# Patient Record
Sex: Male | Born: 1980 | Race: White | Hispanic: No | Marital: Single | Smoking: Never smoker
Health system: Southern US, Community
[De-identification: ages and names within clinical notes are randomized; demographics above are authoritative.]

## PROBLEM LIST (undated history)

## (undated) DIAGNOSIS — T7840XA Allergy, unspecified, initial encounter: Secondary | ICD-10-CM

## (undated) DIAGNOSIS — S42009A Fracture of unspecified part of unspecified clavicle, initial encounter for closed fracture: Secondary | ICD-10-CM

## (undated) DIAGNOSIS — F32A Depression, unspecified: Secondary | ICD-10-CM

## (undated) DIAGNOSIS — F329 Major depressive disorder, single episode, unspecified: Secondary | ICD-10-CM

## (undated) HISTORY — DX: Allergy, unspecified, initial encounter: T78.40XA

## (undated) HISTORY — DX: Major depressive disorder, single episode, unspecified: F32.9

## (undated) HISTORY — DX: Depression, unspecified: F32.A

---

## 2000-06-17 ENCOUNTER — Emergency Department (HOSPITAL_COMMUNITY): Admission: EM | Admit: 2000-06-17 | Discharge: 2000-06-17 | Payer: Self-pay | Admitting: Emergency Medicine

## 2000-06-17 ENCOUNTER — Encounter: Payer: Self-pay | Admitting: Emergency Medicine

## 2006-11-10 ENCOUNTER — Ambulatory Visit: Payer: Self-pay | Admitting: Family Medicine

## 2006-11-10 DIAGNOSIS — H6120 Impacted cerumen, unspecified ear: Secondary | ICD-10-CM | POA: Insufficient documentation

## 2006-11-10 DIAGNOSIS — J309 Allergic rhinitis, unspecified: Secondary | ICD-10-CM | POA: Insufficient documentation

## 2009-05-27 ENCOUNTER — Ambulatory Visit: Payer: Self-pay | Admitting: Family Medicine

## 2009-05-27 ENCOUNTER — Telehealth (INDEPENDENT_AMBULATORY_CARE_PROVIDER_SITE_OTHER): Payer: Self-pay | Admitting: *Deleted

## 2009-05-29 ENCOUNTER — Encounter (INDEPENDENT_AMBULATORY_CARE_PROVIDER_SITE_OTHER): Payer: Self-pay | Admitting: *Deleted

## 2010-05-12 NOTE — Miscellaneous (Signed)
Summary: tb read  Clinical Lists Changes  Observations: Added new observation of TB PPDRESULT: negative (05/29/2009 16:15) Added new observation of PPD RESULT: < 5mm (05/29/2009 16:15) Added new observation of TB-PPD RDDTE: 05/29/2009 (05/29/2009 16:15)      PPD Results    Date of reading: 05/29/2009    Results: < 5mm    Interpretation: negative

## 2010-05-12 NOTE — Progress Notes (Signed)
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  WILL TAKE FORM TO DANIELLE TO HOLD BECAUSE PT WILL BE COMING IN AFTER LAB CLOSES ON THURSDAY (ABOUT 4:20 OR 4:25--WORKS IN [facility]) Initial call taken by: Jerolyn Shin,  May 27, 2009 4:29 PM  Follow-up for Phone Call        left message for pt to call back. Pt needs appt for form to be filled out. Army Fossa CMA  May 27, 2009 4:51 PM     Additional Follow-up for Phone Call Additional follow up Details #2::    PATIENT WILL NOT BE ABLE TO COME FOR ANY OF THE APPT TIMES. PATIENT WILL COME PICK UP HIS FORM WHEN HE COMES TO GET HIS TB TEST READ. Follow-up by: Barb Merino,  May 27, 2009 5:05 PM

## 2010-05-12 NOTE — Assessment & Plan Note (Signed)
Summary: new  to establish,cbs   Vital Signs:  Patient Profile:   30 Years Old Male Height:     70.25 inches Weight:      193.8 pounds Temp:     98.1 degrees F oral Pulse rate:   76 / minute Resp:     20 per minute BP sitting:   120 / 82  (left arm) Cuff size:   regular  Vitals Entered By: Shonna Chock (November 10, 2006 1:57 PM)               PCP:  Laury Axon  Chief Complaint:  NEW PATIENT ESTABLISH: LOOK AT RIGHT EAR-AT TIMES PATIENT IS UNABLE TO HEAR.  History of Present Illness: Stephen Norton here to establish.  Stephen Norton had problem 2 weeks ago with r ear feeling full.  No other complaints except recent allergy acting up.  Current Allergies: No known allergies   Past Medical History:    Allergic rhinitis  Past Surgical History:    Denies surgical history   Family History:    MOTHER:LIVING    FATHER:LIVING    2:BROTHERS-1 ADOPTED    2:SISTERS    HEART: UNCLE-FATHERS SIDE    ZOX:[id number]    Family History Diabetes 1st degree relative: FATHER    LEUKEMIA:FATHER    MS: FATHER      Social History:    Runner, broadcasting/film/video--- history    Occupation:    Married    Never Smoked    Alcohol use-yes    Drug use-no    Regular exercise-yes   Risk Factors:  Tobacco use:  never Passive smoke exposure:  no Drug use:  no HIV high-risk behavior:  no Alcohol use:  yes    Type:  OCASSIONLLY    Drinks per day:  <1    Has patient --       Felt need to cut down:  no       Been annoyed by complaints:  no       Felt guilty about drinking:  no       Needed eye opener in the morning:  no    Counseled to quit/cut down alcohol use:  no Exercise:  yes    Times per week:  7    Type:  walking, running Seatbelt use:  100 %  Family History Risk Factors:    Family History of MI in females < 58 years old:  no    Family History of MI in males < 30 years old:  yes   Review of Systems      See HPI  General      Denies chills, fatigue, fever, loss of appetite, malaise, sleep disorder, sweats, weakness,  and weight loss.  ENT      Complains of decreased hearing.      Denies difficulty swallowing, ear discharge, earache, hoarseness, nasal congestion, nosebleeds, postnasal drainage, ringing in ears, sinus pressure, and sore throat.  CV      Denies bluish discoloration of lips or nails, chest pain or discomfort, difficulty breathing at night, difficulty breathing while lying down, fainting, fatigue, leg cramps with exertion, lightheadness, near fainting, palpitations, shortness of breath with exertion, swelling of feet, swelling of hands, and weight gain.  Resp      Denies chest discomfort, chest pain with inspiration, cough, coughing up blood, excessive snoring, hypersomnolence, morning headaches, pleuritic, shortness of breath, sputum productive, and wheezing.  Allergy      Complains of seasonal allergies and sneezing.  Denies hives or rash, itching eyes, and persistent infections.   Physical Exam  General:     Well-developed,well-nourished,in no acute distress; alert,appropriate and cooperative throughout examination Ears:     R cerumen impaction and L Cerumen impaction.  ---irrigated with good success Nose:     mucosal erythema and mucosal edema.   Mouth:     Oral mucosa and oropharynx without lesions or exudates.  Teeth in good repair. Lungs:     Normal respiratory effort, chest expands symmetrically. Lungs are clear to auscultation, no crackles or wheezes. Heart:     Normal rate and regular rhythm. S1 and S2 normal without gallop, murmur, click, rub or other extra sounds.    Impression & Recommendations:  Problem # 1:  ALLERGIC RHINITIS (ICD-477.9) xyzal samples to try nasocort AQ Call or RTO prn Discussed use of allergy medications and environmental measures.   Problem # 2:  CERUMEN IMPACTION, RIGHT (ICD-380.4) Assessment: New irrigated with success Stephen Norton instructed  not to use Qtips--  but tissue wick instead Call or RTO prn

## 2011-11-18 ENCOUNTER — Telehealth: Payer: Self-pay | Admitting: Family Medicine

## 2011-11-18 NOTE — Telephone Encounter (Signed)
Called cb# ABM LM to call back to go over TB test info & To schedule CPE

## 2011-11-18 NOTE — Telephone Encounter (Signed)
Last seen 11/10/06.  Please advise     KP

## 2011-11-18 NOTE — Telephone Encounter (Signed)
Pt called says he started a new job that requires forms to be filled out for vision, hearing etc & he would need his last TB Test results. I offered him a physical appt 9.30 1st available he stated is was to far off. He wanted to know if we could confirm last TB test (DOS) & results so he could put on his forms. Cb# [phone]

## 2011-11-18 NOTE — Telephone Encounter (Signed)
TB tests are only good for a year---so if it has been over a year he would need another one anyway

## 2011-11-19 NOTE — Telephone Encounter (Signed)
noted 

## 2011-11-19 NOTE — Telephone Encounter (Signed)
Spoke with patient advised as below he will check with his employer as he stated they said he could just bring in his last one and that should be sufficient.  He still does not want to have a physical, but he may call back and request a copy of his last TB test results Pt was advised no dr. Lenox Ponds w/o a physical appt since he has not been seen since 2008

## 2011-11-19 NOTE — Telephone Encounter (Signed)
FYI--- Patient would not schedule an apt.   KP

## 2013-02-24 ENCOUNTER — Emergency Department
Admission: EM | Admit: 2013-02-24 | Discharge: 2013-02-24 | Disposition: A | Discharge status: Home / Self Care | Payer: BC Managed Care – PPO | Source: Home / Self Care | Attending: Family Medicine | Admitting: Family Medicine

## 2013-02-24 ENCOUNTER — Encounter: Payer: Self-pay | Admitting: Emergency Medicine

## 2013-02-24 ENCOUNTER — Emergency Department (INDEPENDENT_AMBULATORY_CARE_PROVIDER_SITE_OTHER): Payer: BC Managed Care – PPO

## 2013-02-24 DIAGNOSIS — S838X9A Sprain of other specified parts of unspecified knee, initial encounter: Secondary | ICD-10-CM

## 2013-02-24 DIAGNOSIS — S86111A Strain of other muscle(s) and tendon(s) of posterior muscle group at lower leg level, right leg, initial encounter: Secondary | ICD-10-CM

## 2013-02-24 DIAGNOSIS — M79609 Pain in unspecified limb: Secondary | ICD-10-CM

## 2013-02-24 HISTORY — DX: Fracture of unspecified part of unspecified clavicle, initial encounter for closed fracture: S42.009A

## 2013-02-24 MED ORDER — MELOXICAM 15 MG PO TABS: 15.0000 mg | ORAL_TABLET | Freq: Every day | ORAL | Status: DC

## 2013-02-24 NOTE — ED Provider Notes (Signed)
CSN: [id number]     Arrival date & time 02/24/13  1437 History   First MD Initiated Contact with Patient 02/24/13 1440     Chief Complaint  Patient presents with  . Leg Pain    HPI  R lower leg pain x 1 week Pt was playing basketball last week Saturday when another player landed into his R lateral calf.  Initial blow was very intense per pt.  Pt states that he has had secondary swelling and bruising or R calf as well as pain with walking over the course of the week.  Has been able to ambulate albeit with pain  Some dependent bruising in ankle with minimal to mild medial ankle pain  No distal numbness or paresthesias.   Some posterior calf pain.  Has been ambulating without issue.      Past Medical History  Diagnosis Date  . Broken clavicle    History reviewed. No pertinent past surgical history. Family History  Problem Relation Age of Onset  . Diabetes Father   . Hypertension Brother   . Heart failure Paternal Uncle    History  Substance Use Topics  . Smoking status: Never Smoker   . Smokeless tobacco: Not on file  . Alcohol Use: No    Review of Systems  All other systems reviewed and are negative.    Allergies  Review of patient's allergies indicates no known allergies.  Home Medications   Current Outpatient Rx  Name  Route  Sig  Dispense  Refill  . meloxicam (MOBIC) 15 MG tablet   Oral   Take 1 tablet (15 mg total) by mouth daily.   30 tablet   1    BP 116/70  Temp(Src) 98.2 F (36.8 C) (Oral)  Resp 16  Ht 5\' 11"  (1.803 m)  Wt 185 lb (83.915 kg)  BMI 25.81 kg/m2  SpO2 99% Physical Exam  Constitutional: He appears well-developed and well-nourished.  HENT:  Head: Normocephalic and atraumatic.  Eyes: Conjunctivae are normal. Pupils are equal, round, and reactive to light.  Neck: Normal range of motion.  Cardiovascular: Normal rate and regular rhythm.   Pulmonary/Chest: Effort normal and breath sounds normal.  Musculoskeletal:        Legs: Diffuse bruising  Minimal swelling + TTP around mid lateral calf Mild posterior calf pain Neurovascularly intact distally    Neurological: He is alert.  Skin: Skin is warm.    ED Course  Procedures (including critical care time) Labs Review Labs Reviewed - No data to display Imaging Review Dg Tibia/fibula Right  02/24/2013   CLINICAL DATA:  Struck in the mid posterior cath 1 week ago.  Pain.  EXAM: RIGHT TIBIA AND FIBULA - 2 VIEW  COMPARISON:  None.  FINDINGS: There is no evidence of fracture or other focal bone lesions. Soft tissues are unremarkable.  IMPRESSION: Negative.   Electronically Signed   By: Rosalie Gums M.D.   On: 02/24/2013 15:37   Dg Ankle Complete Right  02/24/2013   CLINICAL DATA:  Trauma 1 week ago with pain in mid calf. Medial pain.  EXAM: RIGHT ANKLE - COMPLETE 3+ VIEW  COMPARISON:  None.  FINDINGS: No acute fracture or dislocation. Mild tibiotalar osteoarthritis. No significant soft tissue swelling. Base of fifth metatarsal and talar dome intact. Suboptimal mortise view, only minimally oblique.  IMPRESSION: No acute osseous abnormality.   Electronically Signed   By: Jeronimo Greaves M.D.   On: 02/24/2013 15:30      MDM  1. Gastrocnemius strain, right, initial encounter    Suspect traumatic gastrocnemius strain with secondary dependent bruising.  Neurovascularly intact without signs of compartment syndrome Wrapping with NSAIDs Will check LE u/s to rule out DVT, though much less likely Discussed labwork (CK, CMET). Pt declined, feels like he is improving and would like to rule out DVT.  Discussed general and MSK/vascular red flags.  Follow up as needed.      The patient and/or caregiver has been counseled thoroughly with regard to treatment plan and/or medications prescribed including dosage, schedule, interactions, rationale for use, and possible side effects and they verbalize understanding. Diagnoses and expected course of recovery discussed and will  return if not improved as expected or if the condition worsens. Patient and/or caregiver verbalized understanding.          Doree Albee, MD 02/24/13 [phone]

## 2013-02-24 NOTE — ED Notes (Signed)
Reports basketball injury to lower right leg one week ago; some edema, bruising on calf and behind knee. Pain upon certain movements and weight bearing.

## 2013-07-27 ENCOUNTER — Encounter: Payer: Self-pay | Admitting: Family Medicine

## 2013-07-27 ENCOUNTER — Ambulatory Visit (INDEPENDENT_AMBULATORY_CARE_PROVIDER_SITE_OTHER): Payer: BC Managed Care – PPO | Admitting: Family Medicine

## 2013-07-27 VITALS — BP 129/80 | HR 85 | Ht 71.0 in | Wt 186.0 lb

## 2013-07-27 DIAGNOSIS — Z131 Encounter for screening for diabetes mellitus: Secondary | ICD-10-CM

## 2013-07-27 DIAGNOSIS — Z13220 Encounter for screening for lipoid disorders: Secondary | ICD-10-CM

## 2013-07-27 DIAGNOSIS — F329 Major depressive disorder, single episode, unspecified: Secondary | ICD-10-CM

## 2013-07-27 DIAGNOSIS — F3289 Other specified depressive episodes: Secondary | ICD-10-CM

## 2013-07-27 DIAGNOSIS — F32A Depression, unspecified: Secondary | ICD-10-CM

## 2013-07-27 MED ORDER — ESCITALOPRAM OXALATE 10 MG PO TABS: 10.0000 mg | ORAL_TABLET | Freq: Every day | ORAL | Status: DC

## 2013-07-27 NOTE — Progress Notes (Signed)
CC: Stephen Amourverett L Norton is a 33 y.o. 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  He shares a very long and detailed story where it sounds like parents of the children on his baseball team along with one of his former coaching partners were plotting against him and had been looking for an opportunity to require the patient to step down since he has been involved in coaching activities outside of the allowable seasonal time frame based on state high school rules.  The patient describes a life that has been heavily devoted to baseball going back to his childhood and even playing in a minor league setting for [employer].  Since being required to step down from high school coaching he has become more withdrawn, is having relationship difficulties with his wife, and occasionally has thoughts of trying to meddle in the lives of those who targeted him. He clearly states that he has no thoughts of wanting to physically harm himself or others.  Symptoms are moderate to severe in severity. No interventions as of yet.  Review of Systems - General ROS: negative for - chills, fever, night sweats, weight gain or weight loss Ophthalmic ROS: negative for - decreased vision Psychological ROS: negative for - anxiety, paranoia, hallucinations ENT ROS: negative for - hearing change, nasal congestion, tinnitus or allergies Hematological and Lymphatic ROS: negative for - bleeding problems, bruising or swollen lymph nodes Breast ROS: negative Respiratory ROS: no cough, shortness of breath, or wheezing Cardiovascular ROS: no chest pain or dyspnea on exertion Gastrointestinal ROS: no abdominal pain, change in bowel habits, or black or bloody  stools Genito-Urinary ROS: negative for - genital discharge, genital ulcers, incontinence or abnormal bleeding from genitals Musculoskeletal ROS: negative for - joint pain or muscle pain Neurological ROS: negative for - headaches or memory loss Dermatological ROS: negative for lumps, mole changes, rash and skin lesion changes  Past Medical History  Diagnosis Date  . Broken clavicle     History reviewed. No pertinent past surgical history. Family History  Problem Relation Age of Onset  . Diabetes Father   . Hypertension Brother   . Heart failure Paternal Uncle   . Lung cancer Father     History   Social History  . Marital Status: Married    Spouse Name: N/A    Number of Children: N/A  . Years of Education: N/A   Occupational History  . Not on file.   Social History Main Topics  . Smoking status: Never Smoker   . Smokeless tobacco: Not on file  . Alcohol Use: 0.5 - 1.0 oz/week    1-2 drink(s) per week  . Drug Use: No  . Sexual Activity: Yes    Partners: Female   Other Topics Concern  . Not on file   Social History Narrative  . No narrative on file     Objective: BP 129/80  Pulse 85  Ht 5\' 11"  (1.803 m)  Wt 186 lb (84.369 kg)  BMI 25.95 kg/m2  Vital signs reviewed. General: Alert and Oriented, No Acute Distress HEENT: Pupils equal, round, reactive to light. Conjunctivae clear.  External ears unremarkable.  Moist mucous membranes. Lungs: Clear and comfortable work of breathing, speaking  in full sentences without accessory muscle use. Cardiac: Regular rate and rhythm.  Neuro: CN II-XII grossly intact, gait normal. Extremities: No peripheral edema.  Strong peripheral pulses.  Mental Status: No anxiety, nor agitation. Logical though process. Skin: Warm and dry.  Assessment & Plan: Mitzie Naverett was seen today for establish care.  Diagnoses and associated orders for this visit:  Diabetes mellitus screening - BASIC METABOLIC PANEL WITH GFR  Lipid  screening - Lipid Profile  Depression - escitalopram (LEXAPRO) 10 MG tablet; Take 1 tablet (10 mg total) by mouth daily.    He is due for routine dyslipidemia and diabetes screening. Depression: We talked at length today regarding how anybody in his position would feel wronged but it's not okay to allow this to interfere with his life at home and duties as a Runner, broadcasting/film/videoteacher. I've encouraged him to start Lexapro, and he declines a referral for counseling. It sounds like today was the first opportunity that he had to open up and discuss his situation.  45 minutes spent face-to-face during visit today of which at least 50% was counseling or coordinating care regarding: 1. Diabetes mellitus screening   2. Lipid screening   3. Depression       Return in about 4 weeks (around 08/24/2013) for Physical.

## 2014-09-16 IMAGING — CR DG TIBIA/FIBULA 2V*R*
2 series · 2 of 2 positions shown · non-contrast
Comparison: None.

CLINICAL DATA: Struck in the mid posterior cath 1 week ago.  Pain.

EXAM:
RIGHT TIBIA AND FIBULA - 2 VIEW

[view not recorded (1 of 2)]
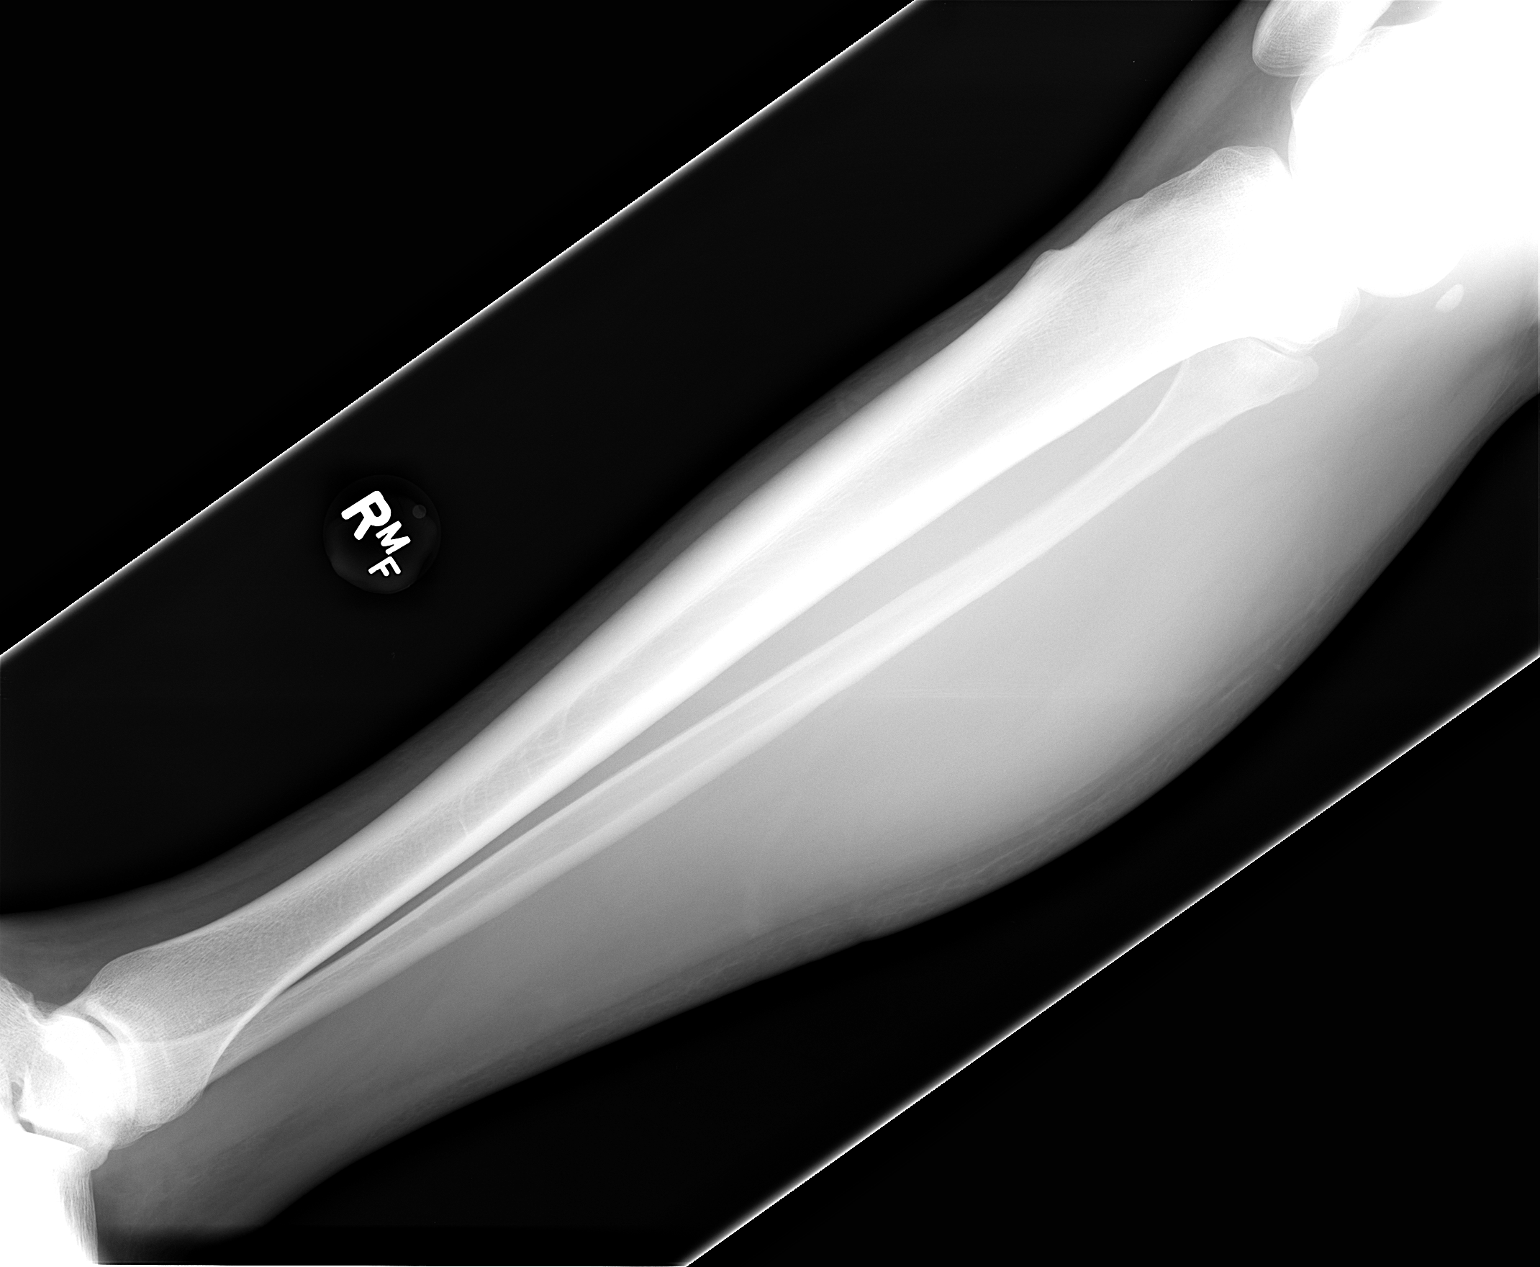

[view not recorded (2 of 2)]
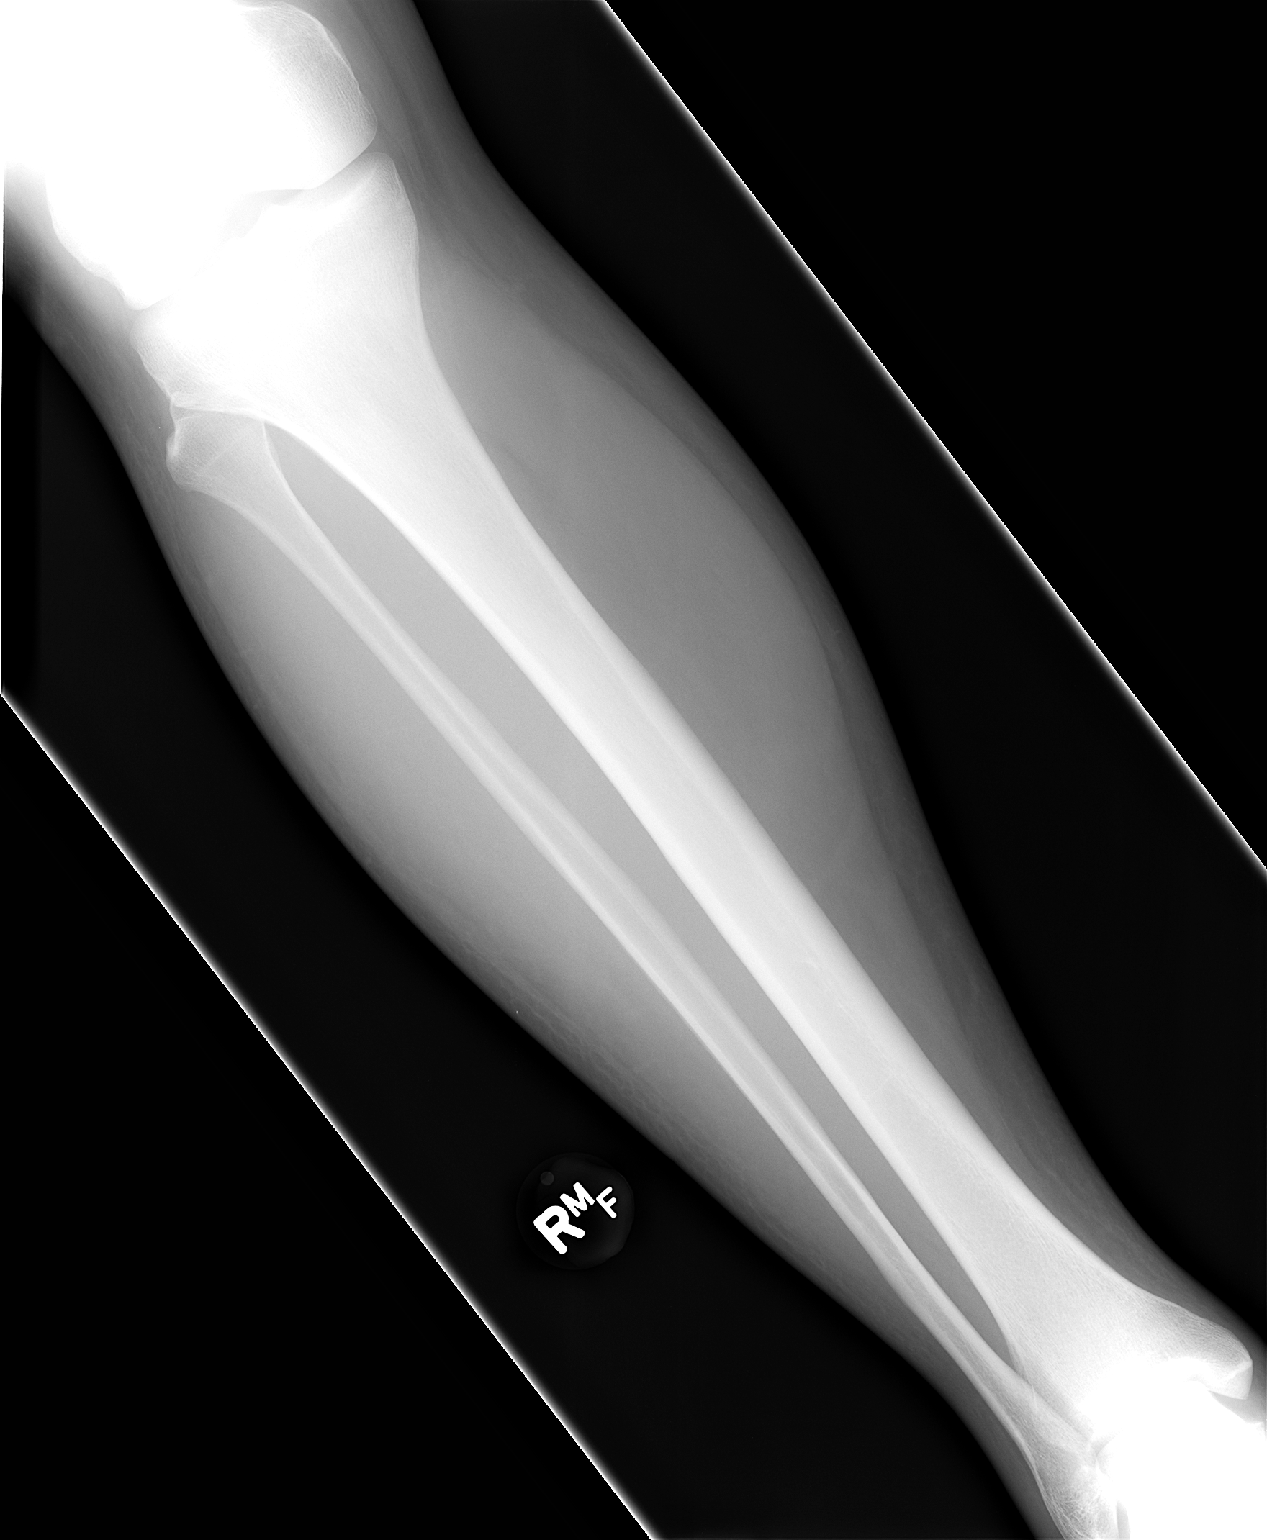

[2 of 2 positions shown; findings below may reference images not displayed]

FINDINGS: There is no evidence of fracture or other focal bone lesions. Soft
tissues are unremarkable.
IMPRESSION: Negative.

## 2014-09-16 IMAGING — CR DG ANKLE COMPLETE 3+V*R*
3 series · 3 of 3 positions shown · non-contrast
Comparison: None.

CLINICAL DATA: Trauma 1 week ago with pain in mid calf. Medial
pain.

EXAM:
RIGHT ANKLE - COMPLETE 3+ VIEW

[view not recorded (1 of 3)]
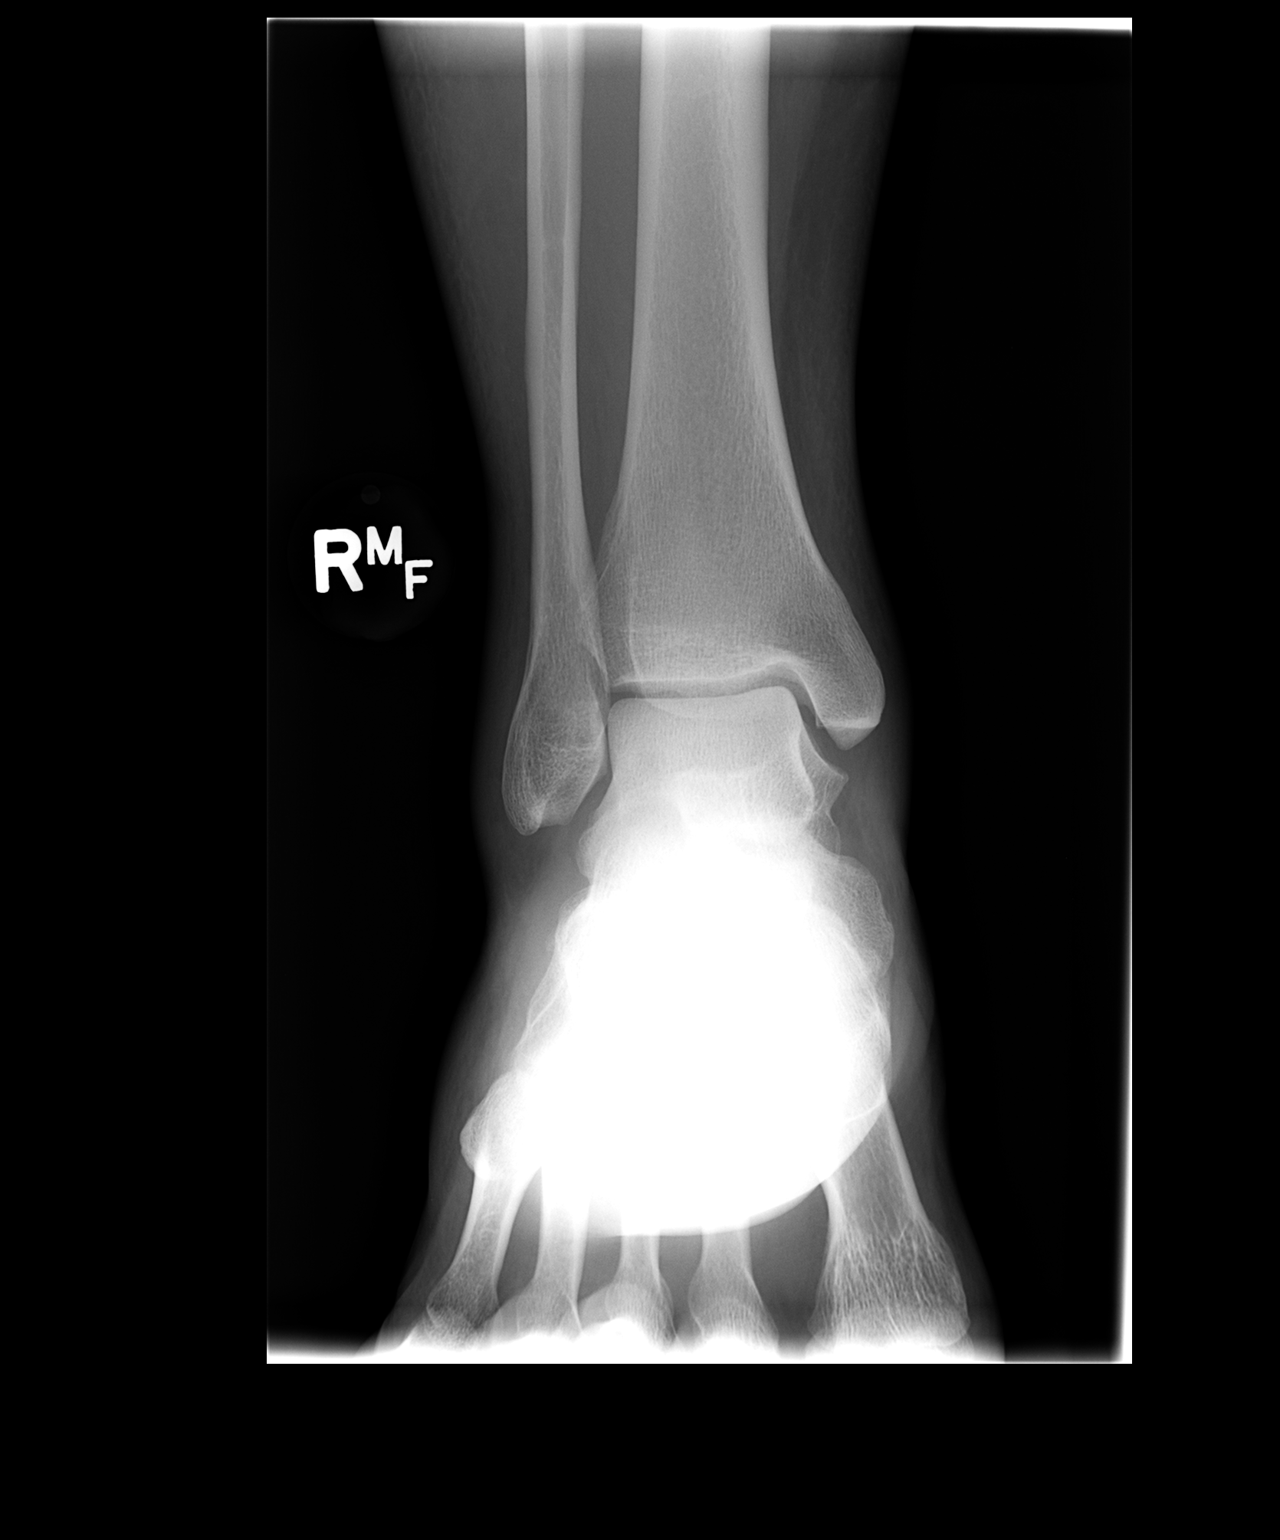

[view not recorded (2 of 3)]
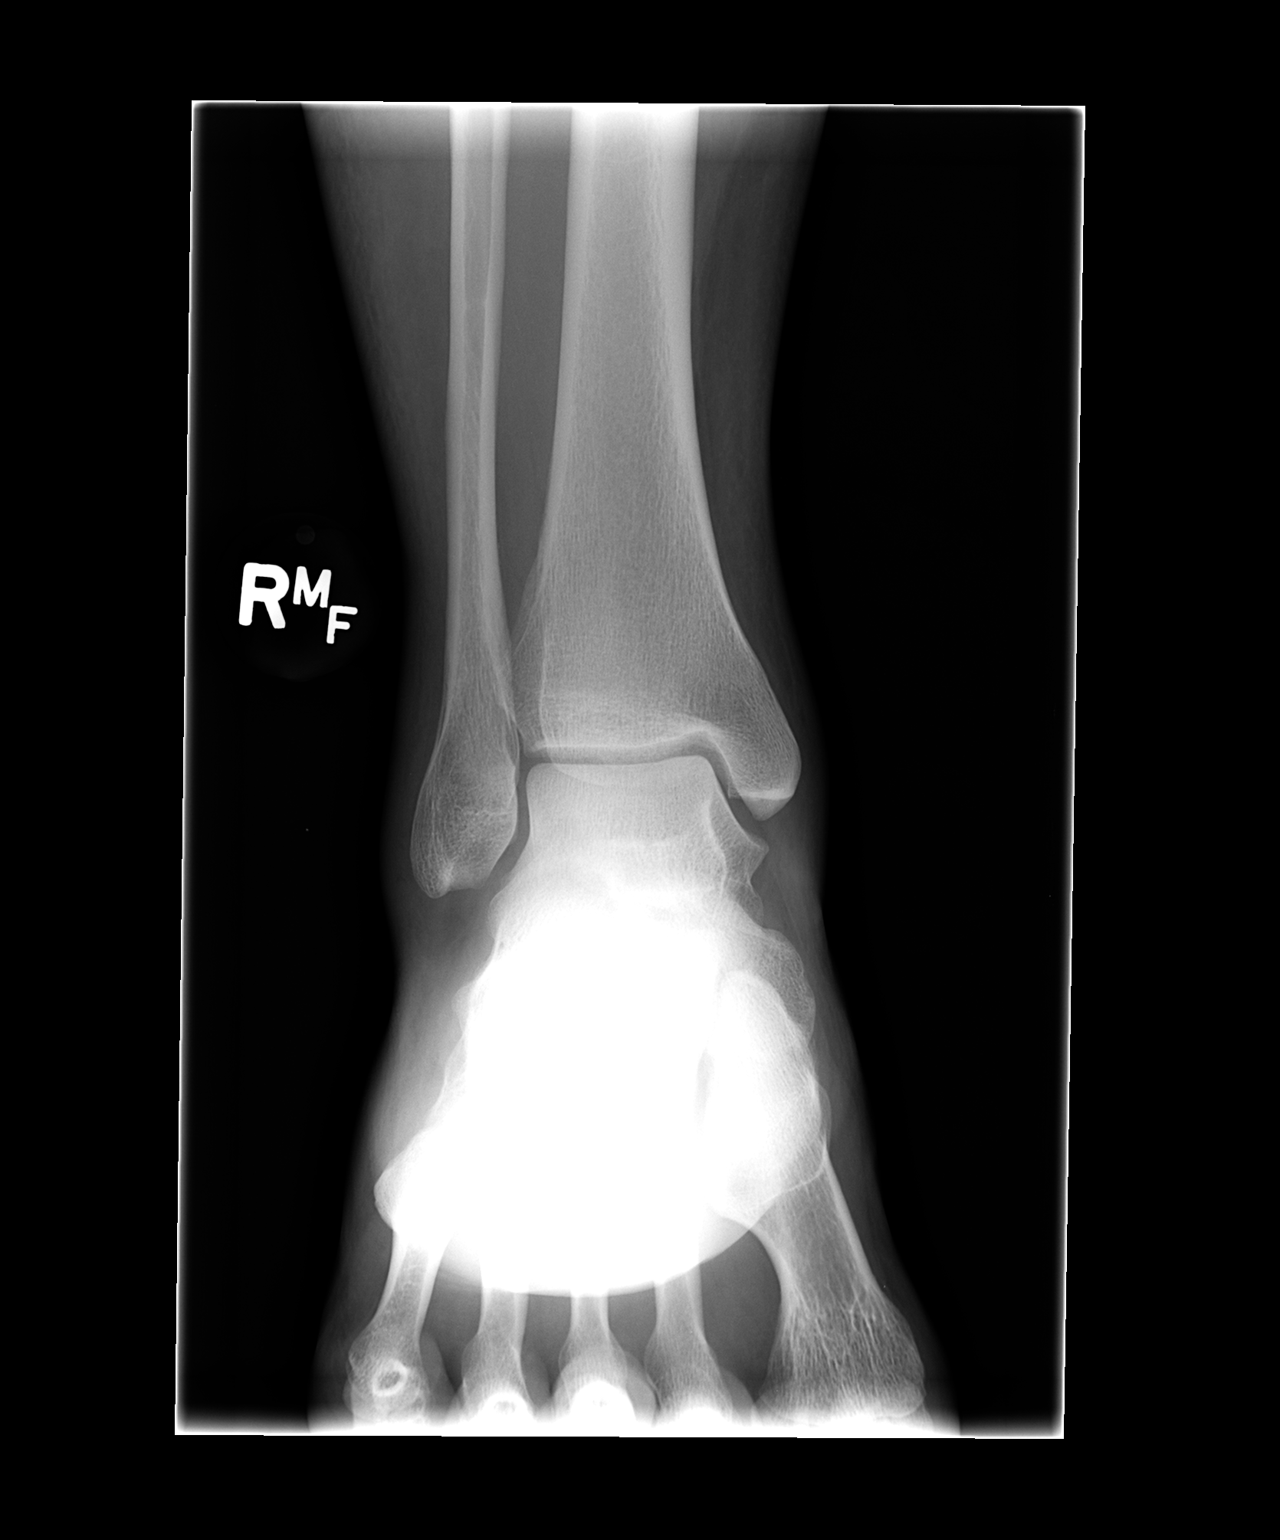

[view not recorded (3 of 3)]
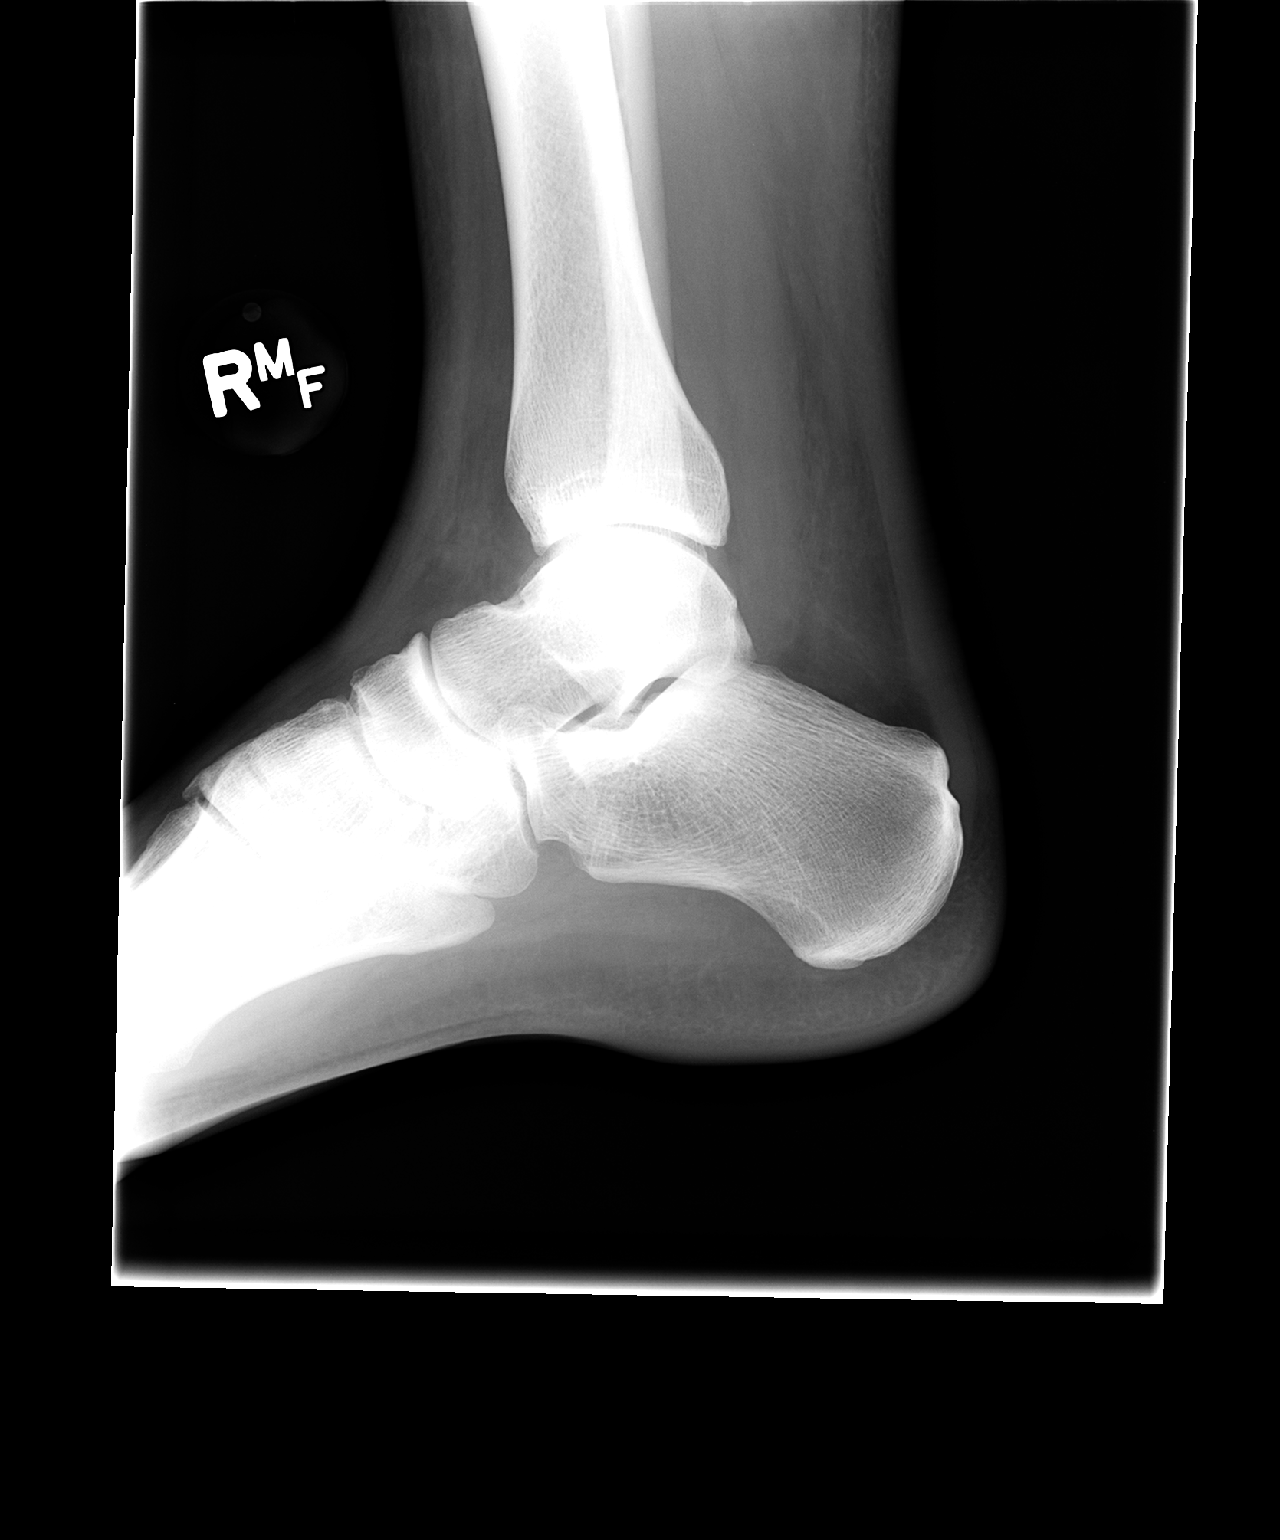

[3 of 3 positions shown; findings below may reference images not displayed]

FINDINGS: No acute fracture or dislocation. Mild tibiotalar osteoarthritis. No
significant soft tissue swelling. Base of fifth metatarsal and talar
dome intact. Suboptimal mortise view, only minimally oblique.
IMPRESSION: No acute osseous abnormality.

## 2015-06-24 ENCOUNTER — Emergency Department
Admission: EM | Admit: 2015-06-24 | Discharge: 2015-06-24 | Disposition: A | Discharge status: Home / Self Care | Payer: BLUE CROSS/BLUE SHIELD | Source: Home / Self Care | Attending: Family Medicine | Admitting: Family Medicine

## 2015-06-24 ENCOUNTER — Encounter: Payer: Self-pay | Admitting: *Deleted

## 2015-06-24 DIAGNOSIS — J029 Acute pharyngitis, unspecified: Secondary | ICD-10-CM

## 2015-06-24 LAB — POCT RAPID STREP A (OFFICE): Rapid Strep A Screen: NEGATIVE

## 2015-06-24 NOTE — Discharge Instructions (Signed)
As cold symptoms develop, try the following: Take plain guaifenesin (  extended release tabs such as Mucinex) twice daily, with plenty of water, for cough and congestion.  May add Pseudoephedrine ( , one or two every 4 to 6 hours) for sinus congestion.  Get adequate rest.   May use Afrin nasal spray (or generic oxymetazoline) twice daily for about 5 days and then discontinue.  Also recommend using saline nasal spray several times daily and saline nasal irrigation (AYR is a common brand).  Use Flonase nasal spray each morning after using Afrin nasal spray and saline nasal irrigation. Try warm salt water gargles for sore throat.  Stop all antihistamines for now, and other non-prescription cough/cold preparations. May take Ibuprofen , 4 tabs every 8 hours with food for sore throat, body aches, etc.   Follow-up with family doctor if not improving about10 days.    Strep Throat Strep throat is a bacterial infection of the throat. Your health care provider may call the infection tonsillitis or pharyngitis, depending on whether there is swelling in the tonsils or at the back of the throat. Strep throat is most common during the cold months of the year in children who are 44-66 years of age, but it can happen during any season in people of any age. This infection is spread from person to person (contagious) through coughing, sneezing, or close contact. CAUSES Strep throat is caused by the bacteria called Streptococcus pyogenes. RISK FACTORS This condition is more likely to develop in:  People who spend time in crowded places where the infection can spread easily.  People who have close contact with someone who has strep throat. SYMPTOMS Symptoms of this condition include:  Fever or chills.   Redness, swelling, or pain in the tonsils or throat.  Pain or difficulty when swallowing.  White or yellow spots on the tonsils or throat.  Swollen, tender glands in the neck or under the  jaw.  Red rash all over the body (rare). DIAGNOSIS This condition is diagnosed by performing a rapid strep test or by taking a swab of your throat (throat culture test). Results from a rapid strep test are usually ready in a few minutes, but throat culture test results are available after one or two days. TREATMENT This condition is treated with antibiotic medicine. HOME CARE INSTRUCTIONS Medicines  Take over-the-counter and prescription medicines only as told by your health care provider.  Take your antibiotic as told by your health care provider. Do not stop taking the antibiotic even if you start to feel better.  Have family members who also have a sore throat or fever tested for strep throat. They may need antibiotics if they have the strep infection. Eating and Drinking  Do not share food, drinking cups, or personal items that could cause the infection to spread to other people.  If swallowing is difficult, try eating soft foods until your sore throat feels better.  Drink enough fluid to keep your urine clear or pale yellow. General Instructions  Gargle with a salt-water mixture 3-4 times per day or as needed. To make a salt-water mixture, completely dissolve -1 tsp of salt in 1 cup of warm water.  Make sure that all household members wash their hands well.  Get plenty of rest.  Stay home from school or work until you have been taking antibiotics for 24 hours.  Keep all follow-up visits as told by your health care provider. This is important. SEEK MEDICAL CARE IF:  The glands in  your neck continue to get bigger.  You develop a rash, cough, or earache.  You cough up a thick liquid that is green, yellow-brown, or bloody.  You have pain or discomfort that does not get better with medicine.  Your problems seem to be getting worse rather than better.  You have a fever. SEEK IMMEDIATE MEDICAL CARE IF:  You have new symptoms, such as vomiting, severe headache, stiff or  painful neck, chest pain, or shortness of breath.  You have severe throat pain, drooling, or changes in your voice.  You have swelling of the neck, or the skin on the neck becomes red and tender.  You have signs of dehydration, such as fatigue, dry mouth, and decreased urination.  You become increasingly sleepy, or you cannot wake up completely.  Your joints become red or painful.   This information is not intended to replace advice given to you by your health care provider. Make sure you discuss any questions you have with your health care provider.   Document Released: 03/26/2000 Document Revised: 12/18/2014 Document Reviewed: 07/22/2014 Elsevier Interactive Patient Education [employer]2016 [employer]

## 2015-06-24 NOTE — ED Provider Notes (Signed)
CSN: [id number]     Arrival date & time 06/24/15  [id number] History   First MD Initiated Contact with Patient 06/24/15 [phone]     Chief Complaint  Patient presents with  . Sore Throat      HPI Comments: Patient complains of four day history of mild sore throat, with gradual development of minimal sinus congestion and cough.  No fevers, chills, and sweats.  He feels well otherwise.  The history is provided by the patient.    Past Medical History  Diagnosis Date  . Broken clavicle    History reviewed. No pertinent past surgical history. Family History  Problem Relation Age of Onset  . Diabetes Father   . Hypertension Brother   . Heart failure Paternal Uncle   . Lung cancer Father    Social History  Substance Use Topics  . Smoking status: Never Smoker   . Smokeless tobacco: None  . Alcohol Use: 0.5 - 1.0 oz/week    1-2 drink(s) per week    Review of Systems + sore throat + minimal cough No pleuritic pain No wheezing + mild nasal congestion ? post-nasal drainage No sinus pain/pressure No itchy/red eyes No earache No hemoptysis No SOB No fever/chills No nausea No vomiting No abdominal pain No diarrhea No urinary symptoms No skin rash + fatigue No myalgias No headache Used OTC meds without relief  Allergies  Review of patient's allergies indicates no known allergies.  Home Medications   Prior to Admission medications   Not on File   Meds Ordered and Administered this Visit  Medications - No data to display  BP 128/86 mmHg  Pulse 92  Temp(Src) 98.3 F (36.8 C) (Oral)  Resp 18  Ht 5\' 11"  (1.803 m)  Wt 206 lb (93.441 kg)  BMI 28.74 kg/m2  SpO2 98% No data found.   Physical Exam Nursing notes and Vital Signs reviewed. Appearance:  Patient appears stated age, and in no acute distress Eyes:  Pupils are equal, round, and reactive to light and accomodation.  Extraocular movement is intact.  Conjunctivae are not inflamed  Ears:  Canals normal.  Tympanic  membranes normal.  Nose:  Mildly congested turbinates.  No sinus tenderness.  Pharynx:  Uvula erythematous Neck:  Supple.  Tender enlarged posterior nodes are palpated bilaterally  Lungs:  Clear to auscultation.  Breath sounds are equal.  Moving air well. Heart:  Regular rate and rhythm without murmurs, rubs, or gallops.  Abdomen:  Nontender without masses or hepatosplenomegaly.  Bowel sounds are present.  No CVA or flank tenderness.  Extremities:  No edema.  Skin:  No rash present.   ED Course  Procedures none    Labs Reviewed  STREP A DNA PROBE  POCT RAPID STREP A (OFFICE) negative      MDM   1. Acute pharyngitis, unspecified etiology; suspect viral URI    There is no evidence of bacterial infection today.   Throat culture pending.  As cold symptoms develop, try the following: Take plain guaifenesin (1200mg  extended release tabs such as Mucinex) twice daily, with plenty of water, for cough and congestion.  May add Pseudoephedrine (30mg , one or two every 4 to 6 hours) for sinus congestion.  Get adequate rest.   May use Afrin nasal spray (or generic oxymetazoline) twice daily for about 5 days and then discontinue.  Also recommend using saline nasal spray several times daily and saline nasal irrigation (AYR is a common brand).  Use Flonase nasal spray each morning after  using Afrin nasal spray and saline nasal irrigation. Try warm salt water gargles for sore throat.  Stop all antihistamines for now, and other non-prescription cough/cold preparations. May take Ibuprofen , 4 tabs every 8 hours with food for sore throat, body aches, etc.   Follow-up with family doctor if not improving about10 days.    Lattie Haw, MD 06/24/15 [phone]

## 2015-06-24 NOTE — ED Notes (Signed)
Pt c/o sore throat x 2 days. Denies fever or congestion.

## 2015-06-25 LAB — STREP A DNA PROBE: GASP: NOT DETECTED

## 2015-06-26 ENCOUNTER — Telehealth: Payer: Self-pay | Admitting: Emergency Medicine

## 2015-06-26 NOTE — ED Notes (Signed)
Inquired about patient's status; encourage them to call with questions/concerns.Left report that StrepDna was negative.

## 2015-08-25 ENCOUNTER — Encounter: Payer: Self-pay | Admitting: Family Medicine

## 2015-08-25 ENCOUNTER — Ambulatory Visit (INDEPENDENT_AMBULATORY_CARE_PROVIDER_SITE_OTHER): Payer: BLUE CROSS/BLUE SHIELD | Admitting: Family Medicine

## 2015-08-25 VITALS — BP 136/83 | HR 83 | Wt 205.0 lb

## 2015-08-25 DIAGNOSIS — J3089 Other allergic rhinitis: Secondary | ICD-10-CM

## 2015-08-25 MED ORDER — TRIAMCINOLONE ACETONIDE 0.1 % EX CREA: TOPICAL_CREAM | CUTANEOUS | Status: DC

## 2015-08-25 NOTE — Progress Notes (Signed)
CC: Stephen Norton is a 35 y.o. male is here for Allergies   Subjective: HPI:  Itching and redness irritation around both eyes. Symptoms have been present for the last month on a daily basis. Improves if he puts a hot compress on the skin. No benefit from moisturizing creams. Symptoms have been getting worse on a weekly basis. He's also noticed that his seasonal allergies are worsening with respect to nasal congestion and postnasal drip. He is taking a daily antihistamine but it doesn't seem to be helping. He denies any vision loss or ocular pain. He denies any redness of the conjunctiva. No other skin changes. No recent change in personal care products   Review Of Systems Outlined In HPI  Past Medical History  Diagnosis Date  . Broken clavicle     No past surgical history on file. Family History  Problem Relation Age of Onset  . Diabetes Father   . Hypertension Brother   . Heart failure Paternal Uncle   . Lung cancer Father     Social History   Social History  . Marital Status: Married    Spouse Name: N/A  . Number of Children: N/A  . Years of Education: N/A   Occupational History  . Not on file.   Social History Main Topics  . Smoking status: Never Smoker   . Smokeless tobacco: Not on file  . Alcohol Use: 0.5 - 1.0 oz/week    1-2 drink(s) per week  . Drug Use: No  . Sexual Activity:    Partners: Female   Other Topics Concern  . Not on file   Social History Narrative     Objective: BP 136/83 mmHg  Pulse 83  Wt 205 lb (92.987 kg)  General: Alert and Oriented, No Acute Distress HEENT: Pupils equal, round, reactive to light. Conjunctivae clear.  External ears unremarkable, canals clear with intact TMs with appropriate landmarks.  Middle ear appears open without effusion. Pink inferior turbinates.  Moist mucous membranes, pharynx without inflammation nor lesions.  Neck supple without palpable lymphadenopathy nor abnormal masses. Lungs: Clear comfortable work of  breathing Cardiac: Regular rate and rhythm.  Extremities: No peripheral edema.  Strong peripheral pulses.  Mental Status: No depression, anxiety, nor agitation. Skin: Warm and dry. Erythema and trace scaling surrounding the eyes  Assessment & Plan: Stephen Norton was seen today for allergies.  Diagnoses and all orders for this visit:  Environmental and seasonal allergies -     triamcinolone cream (KENALOG) 0.1 %; Apply to affected areas twice a day for up to two weeks, avoid face.   Seborrheic dermatitis surrounding the eyes, start triamcinolone cream but avoid getting into the eyes itself. Call if no better by Friday, my next step would be to add topical ketoconazole.  Return if symptoms worsen or fail to improve.

## 2016-05-19 ENCOUNTER — Ambulatory Visit (INDEPENDENT_AMBULATORY_CARE_PROVIDER_SITE_OTHER): Payer: BLUE CROSS/BLUE SHIELD | Admitting: Physician Assistant

## 2016-05-19 ENCOUNTER — Encounter: Payer: Self-pay | Admitting: Physician Assistant

## 2016-05-19 ENCOUNTER — Encounter (INDEPENDENT_AMBULATORY_CARE_PROVIDER_SITE_OTHER): Payer: Self-pay

## 2016-05-19 VITALS — BP 110/62 | HR 106 | Wt 207.0 lb

## 2016-05-19 DIAGNOSIS — Z0000 Encounter for general adult medical examination without abnormal findings: Secondary | ICD-10-CM

## 2016-05-19 DIAGNOSIS — Z23 Encounter for immunization: Secondary | ICD-10-CM

## 2016-05-19 NOTE — Patient Instructions (Signed)
  Physical Activity Recommendations for modifying lipids and lowering blood pressure Engage in aerobic physical activity to reduce LDL-cholesterol, non-HDL-cholesterol, and blood pressure  Frequency: 3-4 sessions per week  Intensity: moderate to vigorous  Duration: 40 minutes on average  Physical Activity Recommendations for secondary prevention 1. Aerobic exercise  Frequency: 3-5 sessions per week  Intensity: 50-80% capacity  Duration: 20 - 60 minutes  Examples: walking, treadmill, cycling, rowing, stair climbing, and arm/leg ergometry  2. Resistance exercise  Frequency: 2-3 sessions per week  Intensity: 10-15 repetitions/set to moderate fatigue  Duration: 1-3 sets of 8-10 upper and lower body exercises  Examples: calisthenics, elastic bands, cuff/hand weights, dumbbels, free weights, wall pulleys, and weight machines  Heart-Healthy Lifestyle  Eating a diet rich in vegetables, fruits and whole grains: also includes low-fat dairy products, poultry, fish, legumes, and nuts; limit intake of sweets, sugar-sweetened beverages and red meats  Getting regular exercise  Maintaining a healthy weight  Not smoking or getting help quitting  Staying on top of your health; for some people, lifestyle changes alone may not be enough to prevent a heart attack or stroke. In these cases, taking a statin at the right dose will most likely be necessary   Preventive Care 18-39 Years, Male Preventive care refers to lifestyle choices and visits with your health care provider that can promote health and wellness. What does preventive care include?  A yearly physical exam. This is also called an annual well check.  Dental exams once or twice a year.  Routine eye exams. Ask your health care provider how often you should have your eyes checked.  Personal lifestyle choices, including: ? Daily care of your teeth and gums. ? Regular physical activity. ? Eating a healthy diet. ? Avoiding  tobacco and drug use. ? Limiting alcohol use. ? Practicing safe sex. What happens during an annual well check? The services and screenings done by your health care provider during your annual well check will depend on your age, overall health, lifestyle risk factors, and family history of disease. Counseling Your health care provider may ask you questions about your:  Alcohol use.  Tobacco use.  Drug use.  Emotional well-being.  Home and relationship well-being.  Sexual activity.  Eating habits.  Work and work environment.  Screening You may have the following tests or measurements:  Height, weight, and BMI.  Blood pressure.  Lipid and cholesterol levels. These may be checked every 5 years starting at age 20.  Diabetes screening. This is done by checking your blood sugar (glucose) after you have not eaten for a while (fasting).  Skin check.  Hepatitis C blood test.  Hepatitis B blood test.  Sexually transmitted disease (STD) testing.  Discuss your test results, treatment options, and if necessary, the need for more tests with your health care provider. Vaccines Your health care provider may recommend certain vaccines, such as:  Influenza vaccine. This is recommended every year.  Tetanus, diphtheria, and acellular pertussis (Tdap, Td) vaccine. You may need a Td booster every 10 years.  Varicella vaccine. You may need this if you have not been vaccinated.  HPV vaccine. If you are 26 or younger, you may need three doses over 6 months.  Measles, mumps, and rubella (MMR) vaccine. You may need at least one dose of MMR.You may also need a second dose.  Pneumococcal 13-valent conjugate (PCV13) vaccine. You may need this if you have certain conditions and have not been vaccinated.  Pneumococcal polysaccharide (PPSV23) vaccine. You   may need one or two doses if you smoke cigarettes or if you have certain conditions.  Meningococcal vaccine. One dose is recommended if  you are age 19-21 years and a first-year college student living in a residence hall, or if you have one of several medical conditions. You may also need additional booster doses.  Hepatitis A vaccine. You may need this if you have certain conditions or if you travel or work in places where you may be exposed to hepatitis A.  Hepatitis B vaccine. You may need this if you have certain conditions or if you travel or work in places where you may be exposed to hepatitis B.  Haemophilus influenzae type b (Hib) vaccine. You may need this if you have certain risk factors.  Talk to your health care provider about which screenings and vaccines you need and how often you need them. This information is not intended to replace advice given to you by your health care provider. Make sure you discuss any questions you have with your health care provider. Document Released: 05/25/2001 Document Revised: 12/17/2015 Document Reviewed: 01/28/2015 Elsevier Interactive Patient Education  2017 [employer] Inc[employer]  

## 2016-05-19 NOTE — Progress Notes (Signed)
HPI:                                                                Melchor Amourverett L Brusca is a 36 y.o. male [facility] today for annual physical exam  He has no concerns today. He has no personal history of HTN or DM.  He denies family history of heart disease or high cholesterol  Patient currently works in [employer] and enjoys his job.  Health Maintenance Health Maintenance  Topic Date Due  . HIV Screening  06/08/1993  . TETANUS/TDAP  06/08/1997  . INFLUENZA VACCINE  11/11/2015    Health Habits  Diet: good  Exercise: almost daily   Past Medical History:  Diagnosis Date  . Allergy   . Broken clavicle   . Depression    No past surgical history on file. Social History  Substance Use Topics  . Smoking status: Never Smoker  . Smokeless tobacco: Not on file  . Alcohol use 0.5 - 1.0 oz/week    1 - 2 drink(s) per week   family history includes Diabetes in his father; Heart attack in his paternal uncle; Hypertension in his brother; Lung cancer in his father; Multiple sclerosis in his father.  Review of Systems  Constitutional: Negative.  Negative for chills, fever and weight loss.  HENT: Negative.   Eyes: Negative.   Respiratory: Negative for shortness of breath and wheezing.   Cardiovascular: Negative for chest pain and palpitations.  Gastrointestinal: Negative for abdominal pain and blood in stool.  Genitourinary: Negative.   Musculoskeletal: Negative.   Skin: Negative.   Neurological: Negative.   Psychiatric/Behavioral: Negative.      Medications: No current outpatient prescriptions on file.   No current facility-administered medications for this visit.    No Known Allergies     Objective:  BP 110/62   Pulse (!) 106   Wt 207 lb (93.9 kg)   BMI 28.87 kg/m  Gen: well-groomed, cooperative, not ill-appearing, no distress HEENT: normal conjunctiva, TM's clear,  oropharynx clear, moist mucus membranes, no thyromegaly or tenderness Pulm: Normal work of breathing, clear to auscultation bilaterally CV: Rate 88, regular rhythm, s1 and s2 distinct, no murmurs, clicks or rubs appreciated on this exam, no carotid bruit GI: soft, nondistended, nontender, no masses Neuro: alert and oriented x 3, EOM's intact, PERRLA, DTR's intact MSK: strength 5/5 and symmetric, normal gait, distal pulses intact, no peripheral edema Skin: warm and dry, no rashes or lesions on exposed skin Psych: normal affect, pleasant mood, normal speech and thought content   No results found for this or any previous visit (from the past 72 hour(s)). No results found.    Assessment and Plan: 36 y.o. male with   Encounter for preventative adult health care examination - CBC - Lipid Panel w/reflex Direct LDL - Comprehensive metabolic panel - Flu Vaccine QUAD 36+ mos IM administered today - counseled on physical activity and heart-healthy lifestyle recommendations  Patient education and anticipatory guidance given Patient agrees with treatment plan Follow-up in 1 year or sooner as needed  Levonne Hubertharley E. Cummings PA-C

## 2016-05-20 LAB — COMPREHENSIVE METABOLIC PANEL
ALT: 14 U/L (ref 9–46)
AST: 19 U/L (ref 10–40)
Albumin: 4.6 g/dL (ref 3.6–5.1)
Alkaline Phosphatase: 88 U/L (ref 40–115)
BUN: 14 mg/dL (ref 7–25)
CO2: 29 mmol/L (ref 20–31)
Calcium: 9.4 mg/dL (ref 8.6–10.3)
Chloride: 102 mmol/L (ref 98–110)
Creat: 1.05 mg/dL (ref 0.60–1.35)
Glucose, Bld: 88 mg/dL (ref 65–99)
Potassium: 4.3 mmol/L (ref 3.5–5.3)
Sodium: 140 mmol/L (ref 135–146)
Total Bilirubin: 0.5 mg/dL (ref 0.2–1.2)
Total Protein: 7.1 g/dL (ref 6.1–8.1)

## 2016-05-20 LAB — LIPID PANEL W/REFLEX DIRECT LDL
Cholesterol: 147 mg/dL (ref <200)
HDL: 41 mg/dL (ref >40)
LDL-Cholesterol: 81 mg/dL
Non-HDL Cholesterol (Calc): 106 mg/dL (ref <130)
Total CHOL/HDL Ratio: 3.6 Ratio (ref <5.0)
Triglycerides: 145 mg/dL (ref <150)

## 2016-05-20 LAB — CBC
HCT: 45.7 % (ref 38.5–50.0)
Hemoglobin: 15 g/dL (ref 13.2–17.1)
MCH: 29 pg (ref 27.0–33.0)
MCHC: 32.8 g/dL (ref 32.0–36.0)
MCV: 88.4 fL (ref 80.0–100.0)
MPV: 10.3 fL (ref 7.5–12.5)
Platelets: 250 10*3/uL (ref 140–400)
RBC: 5.17 MIL/uL (ref 4.20–5.80)
RDW: 12.9 % (ref 11.0–15.0)
WBC: 5.5 10*3/uL (ref 3.8–10.8)

## 2016-05-25 ENCOUNTER — Ambulatory Visit (INDEPENDENT_AMBULATORY_CARE_PROVIDER_SITE_OTHER): Payer: BLUE CROSS/BLUE SHIELD | Admitting: Physician Assistant

## 2016-05-25 VITALS — BP 132/83 | HR 94 | Temp 98.6°F | Wt 209.0 lb

## 2016-05-25 DIAGNOSIS — J111 Influenza due to unidentified influenza virus with other respiratory manifestations: Secondary | ICD-10-CM | POA: Diagnosis not present

## 2016-05-25 MED ORDER — OSELTAMIVIR PHOSPHATE 75 MG PO CAPS: 75.0000 mg | ORAL_CAPSULE | Freq: Two times a day (BID) | ORAL | 0 refills | Status: DC

## 2016-05-25 NOTE — Progress Notes (Signed)
HPI:                                                                Stephen Norton is a 36 y.o. male who [facility] today for flu-like symptoms  Patient presents with sudden onset chills, myalgias, malaise and cough since yesterday. Reports low-grade fever of 99-100 last night. Endorses nausea, no vomiting this morning. He took Dayquil and Ibuprofen this morning with moderate relief. Denies dyspnea, chest pain, and wheezing. No history of asthma or COPD. Nonsmoker. He did receive the influenza vaccine on 05/19/16.    Past Medical History:  Diagnosis Date  . Allergy   . Broken clavicle   . Depression    No past surgical history on file. Social History  Substance Use Topics  . Smoking status: Never Smoker  . Smokeless tobacco: Never Used  . Alcohol use 0.5 - 1.0 oz/week    1 - 2 Standard drinks or equivalent per week   family history includes Diabetes in his father; Heart attack in his paternal uncle; Hypertension in his brother; Lung cancer in his father; Multiple sclerosis in his father.  ROS: negative except as noted in the HPI  Medications: No current outpatient prescriptions on file.   No current facility-administered medications for this visit.    No Known Allergies     Objective:  BP 132/83   Pulse 94   Temp 98.6 F (37 C) (Oral)   Wt 209 lb (94.8 kg)   BMI 29.15 kg/m  Gen: well-groomed, cooperative, ill-appearing, not toxic-appearing, no distress HEENT: normal conjunctiva, canals occluded bilaterally with cerumen, oropharynx clear, no tonsillar exudates, moist mucus membranes, no frontal or maxillary sinus tenderness Pulm: Normal work of breathing, normal phonation, clear to auscultation bilaterally, no wheezes, rales or rhonchi CV: Normal rate, regular rhythm, s1 and s2 distinct, no murmurs, clicks or rubs  GI: soft, nondistended, nontender Neuro: alert and oriented x 3, EOM's intact Lymph: no  cervical or tonsillar adenopathy Skin: warm and dry, no rashes or lesions on exposed skin, no cyanosis   No results found for this or any previous visit (from the past 72 hour(s)). No results found.    Assessment and Plan: 36 y.o. male with    Influenza - oseltamivir (TAMIFLU) 75 MG capsule; Take 1 capsule (75 mg total) by mouth 2 (two) times daily.  Dispense: 10 capsule; Refill: 0 - don't return to work/activities until symptom-free for 24 hours - symptomatic management with Ibuprofen - push PO fluids   Patient education and anticipatory guidance given Patient agrees with treatment plan Follow-up as needed if symptoms worsen or fail to improve  Levonne Hubertharley E. Cummings PA-C

## 2016-05-25 NOTE — Patient Instructions (Addendum)

## 2020-10-01 ENCOUNTER — Emergency Department (INDEPENDENT_AMBULATORY_CARE_PROVIDER_SITE_OTHER)
Admission: EM | Admit: 2020-10-01 | Discharge: 2020-10-01 | Disposition: A | Discharge status: Home / Self Care | Payer: BC Managed Care – PPO | Source: Home / Self Care

## 2020-10-01 ENCOUNTER — Other Ambulatory Visit: Payer: Self-pay

## 2020-10-01 DIAGNOSIS — H938X3 Other specified disorders of ear, bilateral: Secondary | ICD-10-CM

## 2020-10-01 DIAGNOSIS — H6123 Impacted cerumen, bilateral: Secondary | ICD-10-CM | POA: Diagnosis not present

## 2020-10-01 MED ORDER — CARBAMIDE PEROXIDE 6.5 % OT SOLN: 5.0000 [drp] | Freq: Two times a day (BID) | OTIC | 0 refills | Status: AC

## 2020-10-01 NOTE — ED Triage Notes (Signed)
Pt presents to Urgent Care with c/o L ear fullness x 3 days. Reports using Debrox and flushing ear w/ syringe, but no significant improvement.

## 2020-10-01 NOTE — ED Provider Notes (Signed)
[facility]    CSN: [id number] Arrival date & time: 10/01/20  1123      History   Chief Complaint Chief Complaint  Patient presents with  . Ear Fullness    HPI Stephen Norton is a 40 y.o. male.   Patient presents today with a several day history of increasing left ear fullness.  He denies any otalgia, otorrhea, cough, congestion, nausea, vomiting.  He has a history of cerumen impaction and states current symptoms are similar to previous episodes of this condition.  He has tried over-the-counter ceruminolytic's as well as flushing his ear without improvement of symptoms.  He has not had to see an ENT in the past.  He has not tried any other over-the-counter medications for symptom management.  He denies any recent illness or medication changes.  Denies regular ototoxic medication use including NSAIDs.   Past Medical History:  Diagnosis Date  . Allergy   . Broken clavicle   . Depression     Patient Active Problem List   Diagnosis Date Noted  . Environmental and seasonal allergies 08/25/2015    History reviewed. No pertinent surgical history.     Home Medications    Prior to Admission medications   Medication Sig Start Date End Date Taking? Authorizing Provider  carbamide peroxide (DEBROX) 6.5 % OTIC solution Place 5 drops into the right ear 2 (two) times daily. 10/01/20  Yes Marcell Chavarin, Noberto Retort, PA-C    Family History Family History  Problem Relation Age of Onset  . Healthy Mother   . Diabetes Father   . Lung cancer Father   . Multiple sclerosis Father   . Hypertension Brother   . Heart attack Paternal Uncle     Social History Social History   Tobacco Use  . Smoking status: Never  . Smokeless tobacco: Never  Vaping Use  . Vaping Use: Never used  Substance Use Topics  . Alcohol use: Yes    Alcohol/week: 1.0 - 2.0 standard drink    Types: 1 - 2 Standard drinks or equivalent per week    Comment: weekly  . Drug use: No     Allergies    Patient has no known allergies.   Review of Systems Review of Systems  Constitutional:  Negative for activity change, appetite change, fatigue and fever.  HENT:  Positive for ear pain (fullness bilateraly) and hearing loss. Negative for congestion, ear discharge, rhinorrhea, sinus pressure, sneezing and sore throat.   Respiratory:  Negative for shortness of breath.   Cardiovascular:  Negative for chest pain.  Gastrointestinal:  Negative for abdominal pain, diarrhea, nausea and vomiting.  Musculoskeletal:  Negative for arthralgias and myalgias.  Neurological:  Negative for dizziness, light-headedness and headaches.    Physical Exam Triage Vital Signs ED Triage Vitals  Enc Vitals Group     BP 10/01/20 1146 (!) 151/63     Pulse Rate 10/01/20 1146 82     Resp 10/01/20 1146 18     Temp 10/01/20 1146 98.8 F (37.1 C)     Temp Source 10/01/20 1146 Oral     SpO2 10/01/20 1146 99 %     Weight 10/01/20 1143 200 lb (90.7 kg)     Height 10/01/20 1143 5\' 11"  (1.803 m)     Head Circumference --      Peak Flow --      Pain Score 10/01/20 1143 0     Pain Loc --      Pain Edu? --  Excl. in GC? --    No data found.  Updated Vital Signs BP (!) 151/63 (BP Location: Right Arm)   Pulse 82   Temp 98.8 F (37.1 C) (Oral)   Resp 18   Ht 5\' 11"  (1.803 m)   Wt 200 lb (90.7 kg)   SpO2 99%   BMI 27.89 kg/m   Visual Acuity Right Eye Distance:   Left Eye Distance:   Bilateral Distance:    Right Eye Near:   Left Eye Near:    Bilateral Near:     Physical Exam Vitals reviewed.  Constitutional:      General: He is awake.     Appearance: Normal appearance. He is normal weight. He is not ill-appearing.     Comments: Very pleasant male appears stated age in no acute distress  HENT:     Head: Normocephalic and atraumatic.     Right Ear: Tympanic membrane, ear canal and external ear normal. There is impacted cerumen.     Left Ear: Tympanic membrane, ear canal and external ear normal.  There is impacted cerumen. Tympanic membrane is not erythematous or bulging.     Ears:     Comments: Cerumen impaction bilaterally resolved with in office irrigation revealing normal TM on left, however, cerumen persisted on right.    Nose: Nose normal.     Mouth/Throat:     Pharynx: Uvula midline. No oropharyngeal exudate or posterior oropharyngeal erythema.  Cardiovascular:     Rate and Rhythm: Normal rate and regular rhythm.     Heart sounds: Normal heart sounds, S1 normal and S2 normal. No murmur heard. Pulmonary:     Effort: Pulmonary effort is normal. No accessory muscle usage or respiratory distress.     Breath sounds: Normal breath sounds. No stridor. No wheezing, rhonchi or rales.     Comments: Clear to auscultation bilaterally Neurological:     Mental Status: He is alert.  Psychiatric:        Behavior: Behavior is cooperative.     UC Treatments / Results  Labs (all labs ordered are listed, but only abnormal results are displayed) Labs Reviewed - No data to display  EKG   Radiology No results found.  Procedures Procedures (including critical care time)  Medications Ordered in UC Medications - No data to display  Initial Impression / Assessment and Plan / UC Course  I have reviewed the triage vital signs and the nursing notes.  Pertinent labs & imaging results that were available during my care of the patient were reviewed by me and considered in my medical decision making (see chart for details).     Cerumen impaction resolved on right with an office irrigation.  Unfortunately, we were unable to remove cerumen on left.  Patient was encouraged to use over-the-counter ceruminolytic if he develops symptoms in his right ear he is to return for irrigation.  Discussed potential utility of seeing ENT and he was given contact information for local ENT provider.  Discussed alarm symptoms that warrant emergent evaluation.  Strict return precautions given to which patient  expressed understanding.  Final Clinical Impressions(s) / UC Diagnoses   Final diagnoses:  Bilateral impacted cerumen  Sensation of fullness in both ears     Discharge Instructions      Continue using Debrox and right to help soften earwax.  If this becomes bothersome return for reevaluation and we can flush this out.  If you have severe symptoms you can follow-up with ENT as we  discussed.  Avoid putting anything in your ear or submerging her head underwater for at least a few days.  If you develop any pain, fever, changes to your hearing please return for reevaluation.     ED Prescriptions     Medication Sig Dispense Auth. Provider   carbamide peroxide (DEBROX) 6.5 % OTIC solution Place 5 drops into the right ear 2 (two) times daily. 15 mL Charnele Semple K, PA-C      PDMP not reviewed this encounter.   Jeani Hawking, PA-C 10/01/20 1311

## 2020-10-01 NOTE — Discharge Instructions (Addendum)
Continue using Debrox and right to help soften earwax.  If this becomes bothersome return for reevaluation and we can flush this out.  If you have severe symptoms you can follow-up with ENT as we discussed.  Avoid putting anything in your ear or submerging her head underwater for at least a few days.  If you develop any pain, fever, changes to your hearing please return for reevaluation.
